# Patient Record
Sex: Male | Born: 1950 | Race: Black or African American | Hispanic: No | Marital: Married | Smoking: Never smoker
Health system: Southern US, Community
[De-identification: ages and names within clinical notes are randomized; demographics above are authoritative.]

---

## 2000-01-16 ENCOUNTER — Encounter: Payer: Self-pay | Admitting: Emergency Medicine

## 2000-01-16 ENCOUNTER — Emergency Department (HOSPITAL_COMMUNITY): Admission: EM | Admit: 2000-01-16 | Discharge: 2000-01-16 | Payer: Self-pay | Admitting: Emergency Medicine

## 2000-01-19 ENCOUNTER — Emergency Department (HOSPITAL_COMMUNITY): Admission: EM | Admit: 2000-01-19 | Discharge: 2000-01-19 | Payer: Self-pay | Admitting: Emergency Medicine

## 2000-03-03 ENCOUNTER — Emergency Department (HOSPITAL_COMMUNITY): Admission: EM | Admit: 2000-03-03 | Discharge: 2000-03-03 | Payer: Self-pay | Admitting: Emergency Medicine

## 2000-03-08 ENCOUNTER — Ambulatory Visit (HOSPITAL_COMMUNITY): Admission: RE | Admit: 2000-03-08 | Discharge: 2000-03-08 | Payer: Self-pay | Admitting: Specialist

## 2000-03-08 ENCOUNTER — Encounter: Payer: Self-pay | Admitting: Specialist

## 2001-05-21 ENCOUNTER — Emergency Department (HOSPITAL_COMMUNITY): Admission: EM | Admit: 2001-05-21 | Discharge: 2001-05-21 | Payer: Self-pay | Admitting: Emergency Medicine

## 2019-04-23 ENCOUNTER — Emergency Department (HOSPITAL_BASED_OUTPATIENT_CLINIC_OR_DEPARTMENT_OTHER)
Admission: EM | Admit: 2019-04-23 | Discharge: 2019-04-23 | Disposition: A | Discharge status: Home / Self Care | Payer: No Typology Code available for payment source | Attending: Emergency Medicine | Admitting: Emergency Medicine

## 2019-04-23 ENCOUNTER — Encounter (HOSPITAL_BASED_OUTPATIENT_CLINIC_OR_DEPARTMENT_OTHER): Payer: Self-pay | Admitting: Emergency Medicine

## 2019-04-23 ENCOUNTER — Emergency Department (HOSPITAL_BASED_OUTPATIENT_CLINIC_OR_DEPARTMENT_OTHER): Payer: No Typology Code available for payment source

## 2019-04-23 ENCOUNTER — Other Ambulatory Visit: Payer: Self-pay

## 2019-04-23 DIAGNOSIS — I10 Essential (primary) hypertension: Secondary | ICD-10-CM | POA: Diagnosis not present

## 2019-04-23 DIAGNOSIS — S20219A Contusion of unspecified front wall of thorax, initial encounter: Secondary | ICD-10-CM

## 2019-04-23 DIAGNOSIS — Y9389 Activity, other specified: Secondary | ICD-10-CM | POA: Insufficient documentation

## 2019-04-23 DIAGNOSIS — S29012A Strain of muscle and tendon of back wall of thorax, initial encounter: Secondary | ICD-10-CM | POA: Insufficient documentation

## 2019-04-23 DIAGNOSIS — Y999 Unspecified external cause status: Secondary | ICD-10-CM | POA: Insufficient documentation

## 2019-04-23 DIAGNOSIS — S39012A Strain of muscle, fascia and tendon of lower back, initial encounter: Secondary | ICD-10-CM | POA: Diagnosis not present

## 2019-04-23 DIAGNOSIS — Y92410 Unspecified street and highway as the place of occurrence of the external cause: Secondary | ICD-10-CM | POA: Insufficient documentation

## 2019-04-23 DIAGNOSIS — S29019A Strain of muscle and tendon of unspecified wall of thorax, initial encounter: Secondary | ICD-10-CM

## 2019-04-23 DIAGNOSIS — S3992XA Unspecified injury of lower back, initial encounter: Secondary | ICD-10-CM | POA: Diagnosis present

## 2019-04-23 LAB — BASIC METABOLIC PANEL
Anion gap: 10 (ref 5–15)
BUN: 9 mg/dL (ref 8–23)
CO2: 27 mmol/L (ref 22–32)
Calcium: 9.1 mg/dL (ref 8.9–10.3)
Chloride: 102 mmol/L (ref 98–111)
Creatinine, Ser: 1.33 mg/dL — ABNORMAL HIGH (ref 0.61–1.24)
GFR calc Af Amer: >60 mL/min (ref >60)
GFR calc non Af Amer: 55 mL/min — ABNORMAL LOW (ref >60)
Glucose, Bld: 183 mg/dL — ABNORMAL HIGH (ref 70–99)
Potassium: 3.2 mmol/L — ABNORMAL LOW (ref 3.5–5.1)
Sodium: 139 mmol/L (ref 135–145)

## 2019-04-23 LAB — CBC WITH DIFFERENTIAL/PLATELET
Abs Immature Granulocytes: 0.01 10*3/uL (ref 0.00–0.07)
Basophils Absolute: 0 10*3/uL (ref 0.0–0.1)
Basophils Relative: 0 %
Eosinophils Absolute: 0.1 10*3/uL (ref 0.0–0.5)
Eosinophils Relative: 3 %
HCT: 40.2 % (ref 39.0–52.0)
Hemoglobin: 13.2 g/dL (ref 13.0–17.0)
Immature Granulocytes: 0 %
Lymphocytes Relative: 27 %
Lymphs Abs: 0.9 10*3/uL (ref 0.7–4.0)
MCH: 29.4 pg (ref 26.0–34.0)
MCHC: 32.8 g/dL (ref 30.0–36.0)
MCV: 89.5 fL (ref 80.0–100.0)
Monocytes Absolute: 0.2 10*3/uL (ref 0.1–1.0)
Monocytes Relative: 7 %
Neutro Abs: 2.1 10*3/uL (ref 1.7–7.7)
Neutrophils Relative %: 63 %
Platelets: 160 10*3/uL (ref 150–400)
RBC: 4.49 MIL/uL (ref 4.22–5.81)
RDW: 12.8 % (ref 11.5–15.5)
WBC: 3.3 10*3/uL — ABNORMAL LOW (ref 4.0–10.5)
nRBC: 0 % (ref 0.0–0.2)

## 2019-04-23 LAB — TROPONIN I (HIGH SENSITIVITY): Troponin I (High Sensitivity): 6 ng/L (ref <18)

## 2019-04-23 NOTE — Discharge Instructions (Addendum)
Ibuprofen 600 mg every 6 hours as needed for pain.  Follow-up with primary doctor regarding the results of your CT scan.  Radiology is recommending possible MRI or skeletal study to evaluate the incidental abnormal findings in your CT scan.
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
## 2019-04-23 NOTE — ED Notes (Signed)
Patient transported to CT 
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
## 2019-04-23 NOTE — ED Notes (Signed)
Patient transported to X-ray 

## 2019-04-23 NOTE — ED Notes (Signed)
ED Provider at bedside. 

## 2019-05-03 ENCOUNTER — Encounter (HOSPITAL_BASED_OUTPATIENT_CLINIC_OR_DEPARTMENT_OTHER): Payer: Self-pay | Admitting: Emergency Medicine

## 2019-05-03 ENCOUNTER — Emergency Department (HOSPITAL_BASED_OUTPATIENT_CLINIC_OR_DEPARTMENT_OTHER)
Admission: EM | Admit: 2019-05-03 | Discharge: 2019-05-03 | Disposition: A | Discharge status: Home / Self Care | Payer: No Typology Code available for payment source | Attending: Emergency Medicine | Admitting: Emergency Medicine

## 2019-05-03 ENCOUNTER — Other Ambulatory Visit: Payer: Self-pay

## 2019-05-03 DIAGNOSIS — M545 Low back pain: Secondary | ICD-10-CM | POA: Diagnosis present

## 2019-05-03 DIAGNOSIS — S39012D Strain of muscle, fascia and tendon of lower back, subsequent encounter: Secondary | ICD-10-CM | POA: Insufficient documentation

## 2019-05-03 NOTE — Discharge Instructions (Addendum)
You can take over-the-counter ibuprofen or Tylenol for the pain.  Consider seeing a chiropractor or physical therapist.  Please follow-up with your primary care doctor as we discussed to check on your blood pressure, obtain a physical exam, and to arrange for outpatient imaging tests as recommended on your previous ED visit.
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
